# Patient Record
Sex: Male | Born: 1990 | Race: White | Hispanic: No | Marital: Married | Smoking: Current every day smoker
Health system: Southern US, Community
[De-identification: ages and names within clinical notes are randomized; demographics above are authoritative.]

---

## 2012-06-21 ENCOUNTER — Encounter (HOSPITAL_BASED_OUTPATIENT_CLINIC_OR_DEPARTMENT_OTHER): Payer: Self-pay

## 2012-06-21 ENCOUNTER — Emergency Department (HOSPITAL_BASED_OUTPATIENT_CLINIC_OR_DEPARTMENT_OTHER): Payer: Worker's Compensation

## 2012-06-21 ENCOUNTER — Emergency Department (HOSPITAL_BASED_OUTPATIENT_CLINIC_OR_DEPARTMENT_OTHER)
Admission: EM | Admit: 2012-06-21 | Discharge: 2012-06-21 | Disposition: A | Discharge status: Home / Self Care | Payer: Worker's Compensation | Attending: Emergency Medicine | Admitting: Emergency Medicine

## 2012-06-21 DIAGNOSIS — IMO0002 Reserved for concepts with insufficient information to code with codable children: Secondary | ICD-10-CM | POA: Insufficient documentation

## 2012-06-21 DIAGNOSIS — F17200 Nicotine dependence, unspecified, uncomplicated: Secondary | ICD-10-CM | POA: Insufficient documentation

## 2012-06-21 DIAGNOSIS — Z880 Allergy status to penicillin: Secondary | ICD-10-CM | POA: Insufficient documentation

## 2012-06-21 DIAGNOSIS — S5000XA Contusion of unspecified elbow, initial encounter: Secondary | ICD-10-CM | POA: Insufficient documentation

## 2012-06-21 NOTE — ED Provider Notes (Signed)
History     CSN: [id number]  Arrival date & time 06/21/12  2154   First MD Initiated Contact with Patient 06/21/12 2250      Chief Complaint  Patient presents with  . Arm Injury    (Consider location/radiation/quality/duration/timing/severity/associated sxs/prior treatment) HPI Pt reports he was at work tonight when he accidentally struck his L elbow on a piece of equipment. He had mild pain at the time, but then struck his arm again later in the evening and pain was worse. Moderate aching pain worse with movement.   History reviewed. No pertinent past medical history.  History reviewed. No pertinent past surgical history.  No family history on file.  History  Substance Use Topics  . Smoking status: Current Everyday Smoker  . Smokeless tobacco: Not on file  . Alcohol Use: No      Review of Systems All other systems reviewed and are negative except as noted in HPI.   Allergies  Penicillins  Home Medications   Current Outpatient Rx  Name Route Sig Dispense Refill  . CALCIUM CARBONATE ANTACID 500 MG PO CHEW Oral Chew 1 tablet by mouth daily as needed. For acid reflux      BP 133/112  Pulse 82  Temp 98.5 F (36.9 C) (Oral)  Ht 5\' 11"  (1.803 m)  Wt 177 lb (80.287 kg)  BMI 24.69 kg/m2  SpO2 98%  Physical Exam  Nursing note and vitals reviewed. Constitutional: He is oriented to person, place, and time. He appears well-developed and well-nourished.  HENT:  Head: Normocephalic and atraumatic.  Eyes: EOM are normal. Pupils are equal, round, and reactive to light.  Neck: Normal range of motion. Neck supple.  Cardiovascular: Normal rate, normal heart sounds and intact distal pulses.   Pulmonary/Chest: Effort normal and breath sounds normal.  Abdominal: Bowel sounds are normal. He exhibits no distension. There is no tenderness.  Musculoskeletal: Normal range of motion. He exhibits tenderness (tender over the L lateral epicondyl, no tenderness to olecrenon, no  swelling). He exhibits no edema.  Neurological: He is alert and oriented to person, place, and time. He has normal strength. No cranial nerve deficit or sensory deficit.  Skin: Skin is warm and dry. No rash noted.  Psychiatric: He has a normal mood and affect.    ED Course  Procedures (including critical care time)  Labs Reviewed - No data to display Dg Elbow Complete Left  06/21/2012  *RADIOLOGY REPORT*  Clinical Data: Elbow pain after work injury.  LEFT ELBOW - COMPLETE 3+ VIEW  Comparison: None.  Findings: The left elbow appears intact. No evidence of acute fracture or subluxation.  No focal bone lesions.  Bone matrix and cortex appear intact.  No abnormal radiopaque densities in the soft tissues.  No significant effusion.  IMPRESSION: No acute bony abnormalities.   Original Report Authenticated By: Marlon Pel, M.D.      No diagnosis found.    MDM  Xray neg, pt given an ACE wrap for compression, advised RICE therapy. Return to work tomorrow. Drug screen done for work.         Romelle Muldoon B. Bernette Mayers, MD 06/21/12 2315

## 2012-06-21 NOTE — ED Notes (Addendum)
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

## 2012-06-21 NOTE — ED Notes (Signed)
Patient sustained injuries at work tonight; UDS completed.

## 2015-05-16 ENCOUNTER — Other Ambulatory Visit: Payer: Self-pay | Admitting: Orthopedic Surgery

## 2015-05-16 DIAGNOSIS — M5136 Other intervertebral disc degeneration, lumbar region: Secondary | ICD-10-CM

## 2015-05-24 ENCOUNTER — Inpatient Hospital Stay: Admission: RE | Admit: 2015-05-24 | Payer: Self-pay | Source: Ambulatory Visit

## 2015-05-24 ENCOUNTER — Other Ambulatory Visit: Payer: Self-pay

## 2015-05-28 ENCOUNTER — Inpatient Hospital Stay
Admission: RE | Admit: 2015-05-28 | Discharge: 2015-05-28 | Disposition: A | Discharge status: Home / Self Care | Payer: Self-pay | Source: Ambulatory Visit | Attending: Orthopedic Surgery | Admitting: Orthopedic Surgery

## 2015-05-28 ENCOUNTER — Other Ambulatory Visit: Payer: Self-pay | Admitting: Orthopedic Surgery

## 2015-05-28 ENCOUNTER — Ambulatory Visit
Admission: RE | Admit: 2015-05-28 | Discharge: 2015-05-28 | Disposition: A | Discharge status: Home / Self Care | Payer: Worker's Compensation | Source: Ambulatory Visit | Attending: Orthopedic Surgery | Admitting: Orthopedic Surgery

## 2015-05-28 DIAGNOSIS — R52 Pain, unspecified: Secondary | ICD-10-CM

## 2015-05-28 DIAGNOSIS — M5136 Other intervertebral disc degeneration, lumbar region: Secondary | ICD-10-CM

## 2015-05-28 MED ORDER — OXYCODONE-ACETAMINOPHEN 5-325 MG PO TABS
2.0000 | ORAL_TABLET | Freq: Once | ORAL | Status: AC
  Administered 2015-05-28: 2 via ORAL

## 2015-05-28 MED ORDER — MIDAZOLAM HCL 2 MG/2ML IJ SOLN
1.0000 mg | INTRAMUSCULAR | Status: DC | PRN
Start: 2015-05-28 — End: 2015-05-29
  Administered 2015-05-28 (×2): 1 mg via INTRAVENOUS

## 2015-05-28 MED ORDER — IOHEXOL 180 MG/ML  SOLN
3.5000 mL | Freq: Once | INTRAMUSCULAR | Status: DC | PRN
  Administered 2015-05-28: 3.5 mL

## 2015-05-28 MED ORDER — KETOROLAC TROMETHAMINE 30 MG/ML IJ SOLN
30.0000 mg | Freq: Once | INTRAMUSCULAR | Status: AC
  Administered 2015-05-28: 30 mg via INTRAVENOUS

## 2015-05-28 MED ORDER — FENTANYL CITRATE (PF) 100 MCG/2ML IJ SOLN
25.0000 ug | INTRAMUSCULAR | Status: DC | PRN
  Administered 2015-05-28 (×2): 50 ug via INTRAVENOUS

## 2015-05-28 MED ORDER — VANCOMYCIN HCL IN DEXTROSE 1-5 GM/200ML-% IV SOLN
1000.0000 mg | Freq: Once | INTRAVENOUS | Status: AC
  Administered 2015-05-28: 1000 mg via INTRAVENOUS

## 2015-05-28 MED ORDER — HYDROXYZINE HCL 25 MG PO TABS
25.0000 mg | ORAL_TABLET | Freq: Once | ORAL | Status: AC
  Administered 2015-05-28: 25 mg via ORAL

## 2015-05-28 MED ORDER — SODIUM CHLORIDE 0.9 % IV SOLN
Freq: Once | INTRAVENOUS | Status: AC
  Administered 2015-05-28: 07:00:00 via INTRAVENOUS

## 2015-05-28 MED ORDER — HYDROXYZINE HCL 50 MG/ML IM SOLN
25.0000 mg | Freq: Once | INTRAMUSCULAR | Status: AC
  Administered 2015-05-28: 25 mg via INTRAMUSCULAR

## 2015-05-28 NOTE — Discharge Instructions (Signed)
Discogram Post Procedure Discharge Instructions ° °1. May resume a regular diet and any medications that you routinely take (including pain medications). °2. No driving day of procedure. °3. Upon discharge go home and rest for at least 4 hours.  May use an ice pack as needed to injection sites on back.  Ice to back 30 minutes on and 30 minutes off, all day. °4. May remove bandades later, today. °5. It is not unusual to be sore for several days after this procedure. ° ° ° °Please contact our office at [phone] for the following symptoms: ° °· Fever greater than 100 degrees °· Increased swelling, pain, or redness at injection site. ° ° °Thank you for visiting [facility]. ° ° °

## 2015-05-28 NOTE — Progress Notes (Signed)
These vital signs obtained @ [phone]

## 2015-05-28 NOTE — Progress Notes (Signed)
Sedation time 31 minutes for discogram.  jkl

## 2015-06-07 ENCOUNTER — Telehealth: Payer: Self-pay | Admitting: Radiology

## 2015-06-07 NOTE — Telephone Encounter (Signed)
Explained it was probably a localized phlebitis from the IV site, asked pt to soak arm in epsom salt for a few times a day and if no improvement to call me next week.

## 2016-02-05 IMAGING — CT CT L SPINE W/ CM
3 of 6 series · 12 of 33 positions shown, 14 images · non-contrast
Comparison: none

CLINICAL DATA: Lumbago/low back pain. Mid back pain radiating to
both gluteal regions. Degenerative disc disease at L4-L5-L5-S1.
TECHNIQUE: Contiguous axial images were obtained from the inferior aspect of L3
through S1. Coronal and sagittal reconstructions of the disc spaces
were created.

[Series 3: l spine soft · axial · 0.27mm/px · z∈[-312,-192]mm · 4 of 56 slices shown, 5 images]
[im 8/56  soft-tissue]
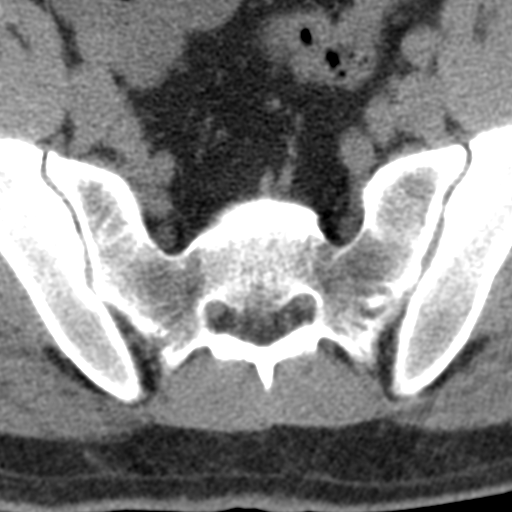
[im 8/56  bone]
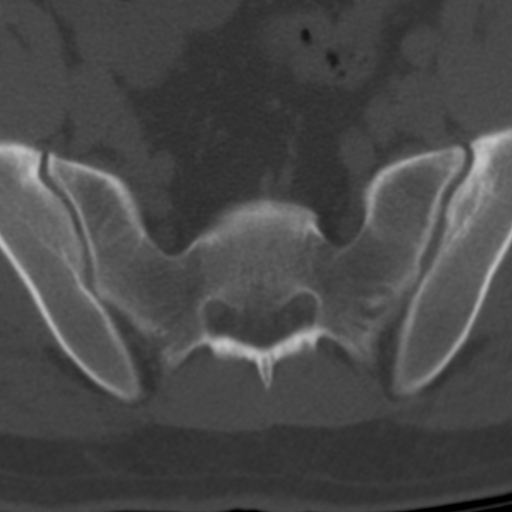
[im 24/56  bone]
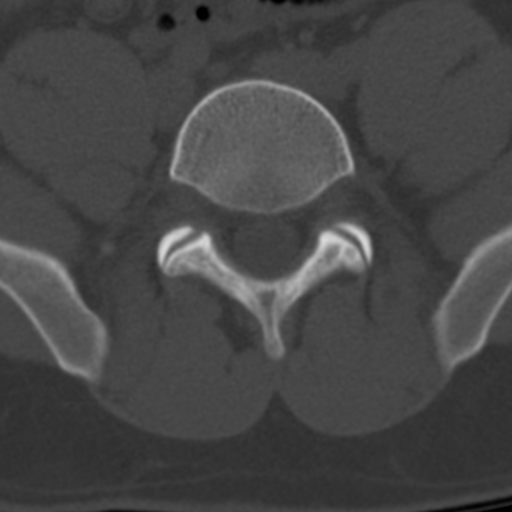
[im 32/56  bone]
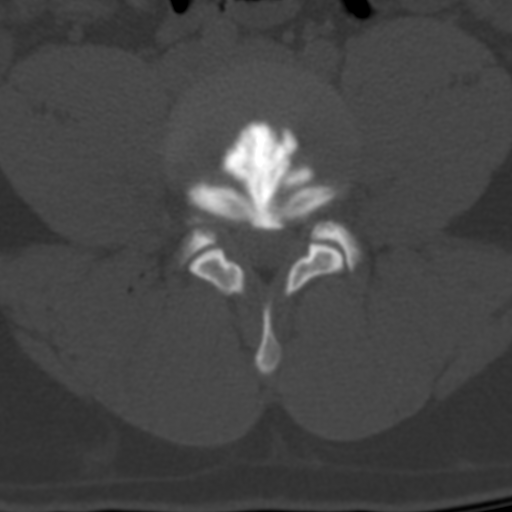
[im 48/56  bone]
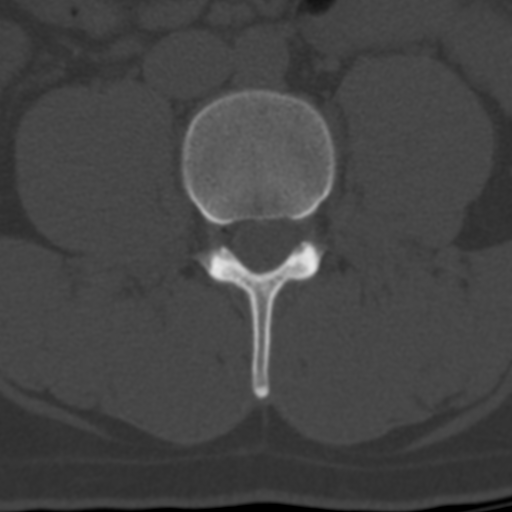

[Series 7: bone cor · coronal · 0.27mm/px · 3 of 30 slices shown]
[im 6/30  bone]
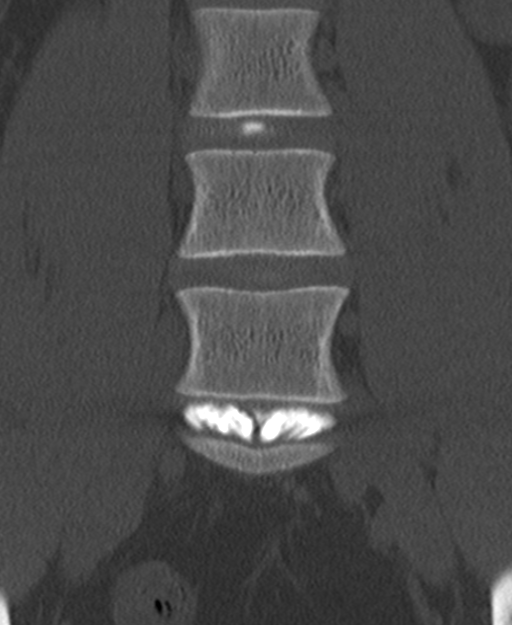
[im 12/30  bone]
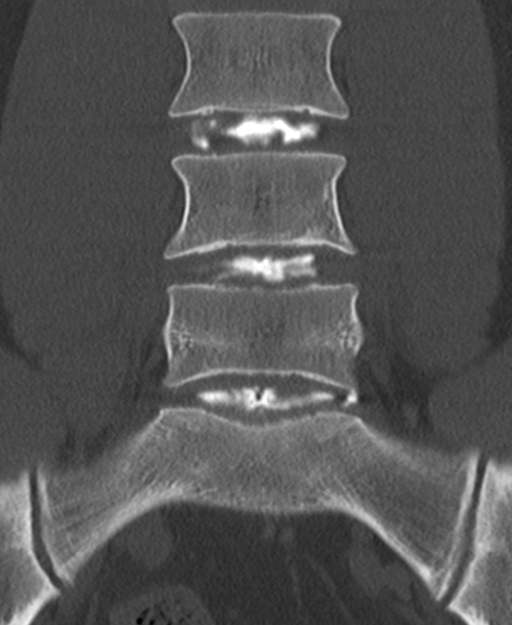
[im 18/30  bone]
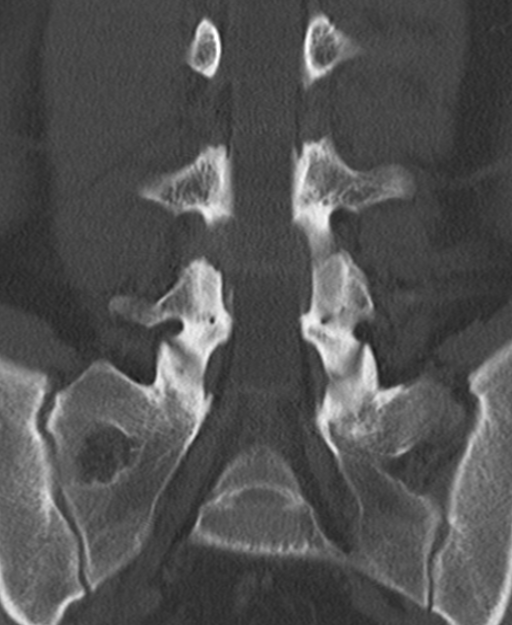

[Series 8: sag bone · sagittal · 0.24mm/px · 5 of 34 slices shown, 6 images]
[im 12/34  bone]
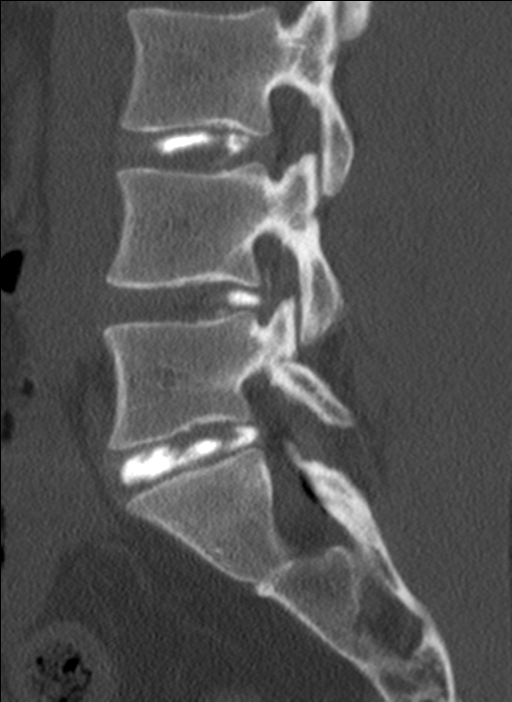
[im 14/34  bone]
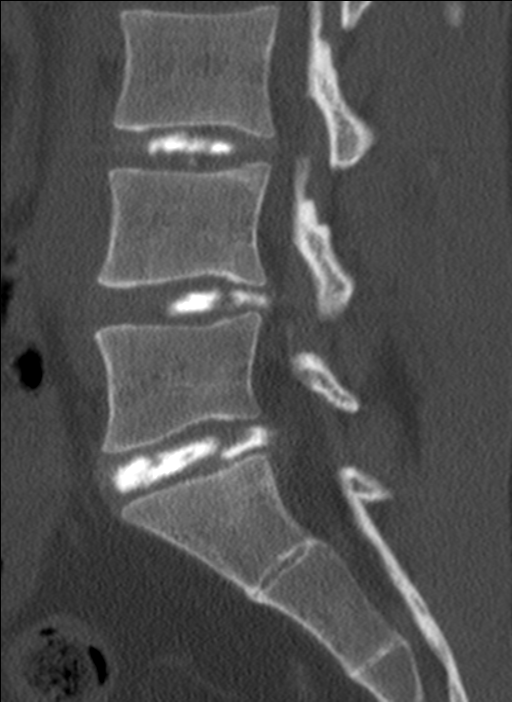
[im 17/34  soft-tissue]
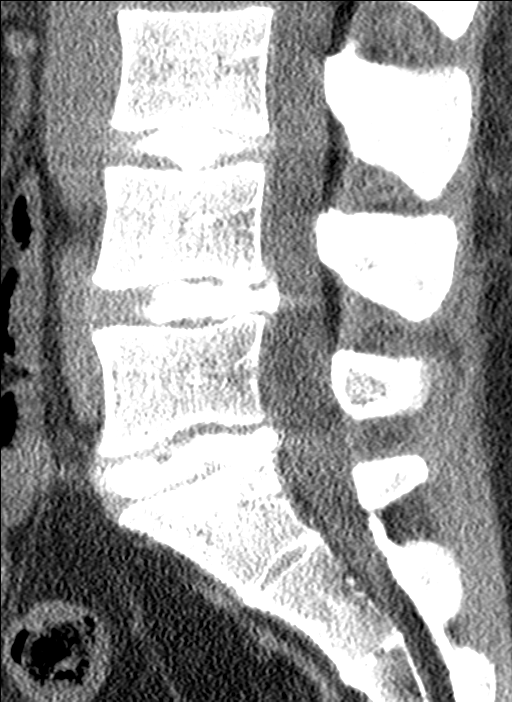
[im 17/34  bone]
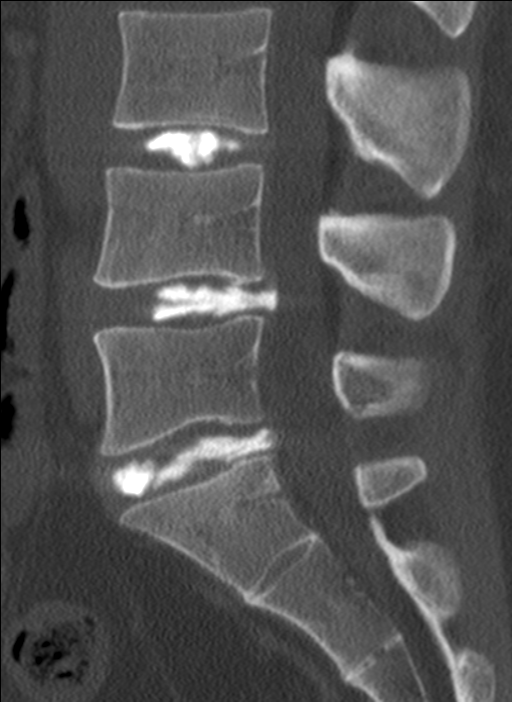
[im 20/34  bone]
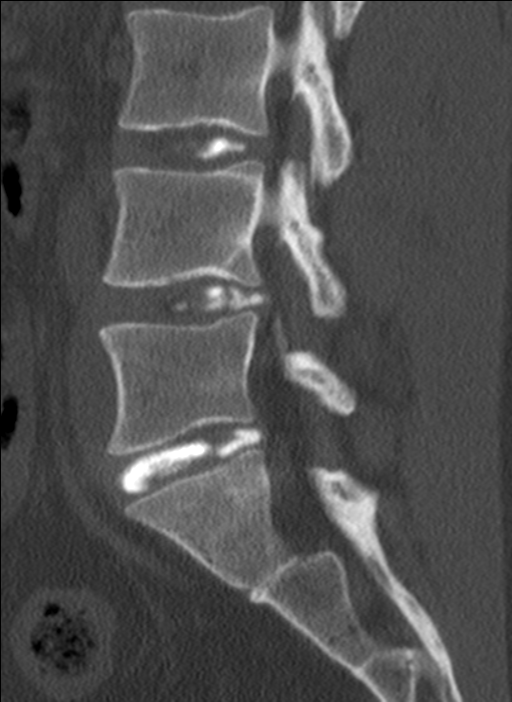
[im 23/34  bone]
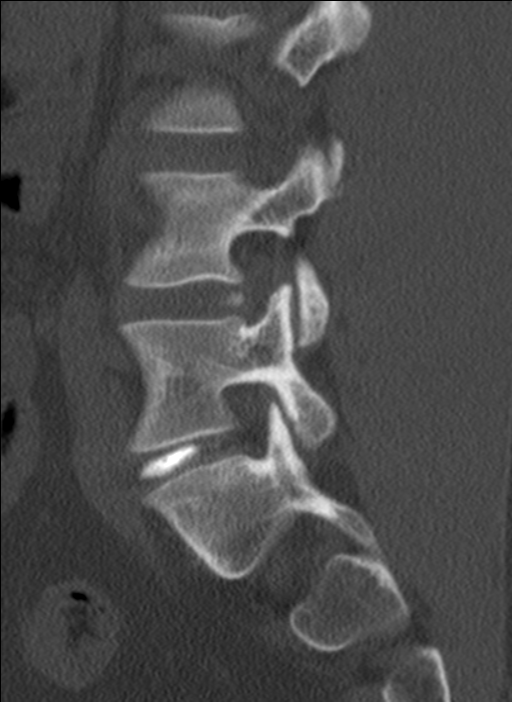

[12 of 33 positions shown; findings below may reference images not displayed]

EXAM:
LUMBAR DISCOGRAM L3-L4, L4-L5 and L5-S1.

FLUOROSCOPY TIME:  3 minutes 53 seconds

Dose area product: 524.63 microGy*m^2

PROCEDURE:
After a thorough discussion of risks and benefits of the procedure,
written and oral informed consent was obtained. Specific risks
included discitis, infection of soft tissue and/or bone, and
nontarget injection. Accelerated progression of degenerative disk
disease was also discussed. General risks of the procedure including
bleeding, infection, and injury to nerves, blood vessels, and
adjacent structures. Verbal consent was obtained by Dr. Gongadze prior
to the procedure.

Discography was performed via a RIGHT posterior oblique approach.
Prior to prep and draped in the usual sterile fashion, the entry
sites were marked using fluoroscopy. Subsequently, using stringent
sterile technique, the back was prepped and draped. 1 g IV
vancomycin as an IV antibiotic was completed 30 minutes prior to
needle stick. 1 mL Antibiotic was also mixed with Cmnipaque-TCO used
for disc injection.

All elements of maximum barrier sterile technique were employed
(cap, mask, sterile gown and gloves, large sterile sheet, hand
hygiene, betadine scrub or alternative skin antisepsis). After local
anesthesia was provided with 1% lidocaine without epinephrine to the
skin and deeper tissues spinal needles were inserted. Under
meticulous sterile technique, 15, 15, 20 cm 22-gauge needles were
advanced fluoroscopically into the disc spaces of L3-L4 and L4-L5
using intermittent fluoroscopy. One satisfactory needle position was
achieved, contrast was injected.

L3-L4: Total volume injected: 1.0 mL. Opening pressure was 10 PSI.
Pressure endpoint 50 PSI. Pain reported [DATE] (1 - 10 scale).
Sensation described as pressure. Pattern of opacification showed
normal nuclear opacification.. Representative images obtained in AP
and lateral projections.

L4-L5: Total volume injected: 1.0 mL. Opening pressure was 0 PSI.
Pressure endpoint 25 PSI. Pain reported [DATE] (1 - 10 scale).
Sensation described as concordant pain. Low back and LEFT gluteal
pain.. Pattern of opacification showed posterior annular fissuring
which appeared to be grade IV. No epidural spread of contrast..
Representative images obtained in AP and lateral projections.

L5-S1: Total volume injected: 1.5 mL. Opening pressure was 0 PSI.
Pressure endpoint 50 PSI. Pain reported [DATE] (1 - 10 scale).
Sensation described as pressure. Pattern of opacification showed
diffuse circumferential annular tearing. Nuclear opacification was
achieved. No epidural spread of contrast.. Representative images
obtained in AP and lateral projections.

Sedation was administered with 2 mg of intravenous Versed, 100 mcg
Fentanyl IV.

CT POST-DISCOGRAM
FINDINGS: The paraspinal soft tissues appear normal. Sacroiliac joints appear
normal for age.

L3-L4: Normal nuclear opacification. Central canal, subarticular
zones and foramina appear patent. No annular fissuring.

L4-L5: There is a central protrusion with grade IV annular fissure
in the midline. This is best seen on the axial images (image 25
series 4). Protrusion produces very mild central stenosis. There is
a small amount of extravasation along with needle track on the
RIGHT. Facet joints appear normal. The disc preferentially opacifies
dorsally, with poor anterior nuclear opacification due to
decompression through the annular fissure.

L5-S1: Shallow broad-based posterior disc bulge. Shallow posterior
annular fissuring with grade IV annular tearing nearly extending to
the surface of the annulus. No epidural spread of contrast. Facet
joints appear normal. Central canal and subarticular zones patent.
Foramina are patent.
IMPRESSION: LUMBAR DISCOGRAM IMPRESSION:

1. Technically successful 3 level lumbar discogram with good control
levels.
2. Concordant [DATE] pain at L4-L5.
3. Normal control level of L3-L4 and L4-L5.

CT POST-DISCOGRAM IMPRESSION:

1. Normal L3-L4 disc.
2. L4-L5 central protrusion and grade IV annular fissure. Mild
central stenosis.
3. L5-S1 disc degeneration with posterior grade IV annular fissuring
but no stenosis or focal protrusion.
